# Patient Record
Sex: Male | Born: 1995
Health system: Southern US, Community
[De-identification: ages and names within clinical notes are randomized; demographics above are authoritative.]

## PROBLEM LIST (undated history)

## (undated) HISTORY — PX: CIRCUMCISION: SUR203

---
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
## 2018-08-12 DIAGNOSIS — J029 Acute pharyngitis, unspecified: Secondary | ICD-10-CM | POA: Diagnosis not present

## 2018-08-12 DIAGNOSIS — J0101 Acute recurrent maxillary sinusitis: Secondary | ICD-10-CM | POA: Diagnosis not present

## 2019-04-12 ENCOUNTER — Other Ambulatory Visit: Payer: Self-pay

## 2019-04-12 DIAGNOSIS — Z20822 Contact with and (suspected) exposure to covid-19: Secondary | ICD-10-CM

## 2019-04-14 LAB — NOVEL CORONAVIRUS, NAA: SARS-CoV-2, NAA: NOT DETECTED
